# Patient Record
Sex: Male | Born: 1985 | Race: White | Hispanic: No | Marital: Single | Smoking: Current every day smoker
Health system: Southern US, Community
[De-identification: ages and names within clinical notes are randomized; demographics above are authoritative.]

## PROBLEM LIST (undated history)

## (undated) DIAGNOSIS — G5600 Carpal tunnel syndrome, unspecified upper limb: Secondary | ICD-10-CM

## (undated) DIAGNOSIS — F410 Panic disorder [episodic paroxysmal anxiety] without agoraphobia: Secondary | ICD-10-CM

---

## 2004-08-16 ENCOUNTER — Emergency Department: Payer: Self-pay | Admitting: Emergency Medicine

## 2005-01-09 ENCOUNTER — Emergency Department: Payer: Self-pay | Admitting: Internal Medicine

## 2005-03-23 ENCOUNTER — Emergency Department: Payer: Self-pay | Admitting: Emergency Medicine

## 2010-03-23 ENCOUNTER — Emergency Department: Payer: Self-pay | Admitting: Emergency Medicine

## 2010-03-24 ENCOUNTER — Emergency Department: Payer: Self-pay | Admitting: Emergency Medicine
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
## 2013-04-10 ENCOUNTER — Emergency Department (HOSPITAL_COMMUNITY)
Admission: EM | Admit: 2013-04-10 | Discharge: 2013-04-10 | Disposition: A | Discharge status: Home / Self Care | Payer: BC Managed Care – PPO | Attending: Emergency Medicine | Admitting: Emergency Medicine

## 2013-04-10 ENCOUNTER — Encounter (HOSPITAL_COMMUNITY): Payer: Self-pay | Admitting: Adult Health

## 2013-04-10 DIAGNOSIS — F17200 Nicotine dependence, unspecified, uncomplicated: Secondary | ICD-10-CM | POA: Insufficient documentation

## 2013-04-10 DIAGNOSIS — Z79899 Other long term (current) drug therapy: Secondary | ICD-10-CM | POA: Insufficient documentation

## 2013-04-10 DIAGNOSIS — M5412 Radiculopathy, cervical region: Secondary | ICD-10-CM | POA: Insufficient documentation

## 2013-04-10 DIAGNOSIS — Z9109 Other allergy status, other than to drugs and biological substances: Secondary | ICD-10-CM | POA: Insufficient documentation

## 2013-04-10 DIAGNOSIS — G5600 Carpal tunnel syndrome, unspecified upper limb: Secondary | ICD-10-CM | POA: Insufficient documentation

## 2013-04-10 HISTORY — DX: Carpal tunnel syndrome, unspecified upper limb: G56.00

## 2013-04-10 MED ORDER — NAPROXEN 500 MG PO TABS: 500.0000 mg | ORAL_TABLET | Freq: Two times a day (BID) | ORAL | Status: AC

## 2013-04-10 MED ORDER — DEXAMETHASONE SODIUM PHOSPHATE 10 MG/ML IJ SOLN
10.0000 mg | Freq: Once | INTRAMUSCULAR | Status: AC
  Administered 2013-04-10: 10 mg via INTRAMUSCULAR
  Filled 2013-04-10: qty 1

## 2013-04-10 MED ORDER — ONDANSETRON 4 MG PO TBDP
ORAL_TABLET | ORAL | Status: AC
  Administered 2013-04-10: 8 mg
  Filled 2013-04-10: qty 2

## 2013-04-10 MED ORDER — KETOROLAC TROMETHAMINE 60 MG/2ML IM SOLN
60.0000 mg | Freq: Once | INTRAMUSCULAR | Status: AC
  Administered 2013-04-10: 60 mg via INTRAMUSCULAR
  Filled 2013-04-10: qty 2

## 2013-04-10 NOTE — ED Notes (Signed)
Presents with right arm, neck and shoulder pain that began this am after waking up. He states" have no strength in this arm and it hurts really bad. It began a while ago and just felt like a sore muscle now it really hurts"  Denies injury.

## 2013-04-10 NOTE — ED Provider Notes (Signed)
History  This chart was scribed for non-physician practitioner Dierdre Forth, PA-C working with Vida Roller, MD, by Candelaria Stagers, ED Scribe. This patient was seen in room TR11C/TR11C and the patient's care was started at 5:48 PM   CSN: [id number]  Arrival date & time 04/10/13  1726   First MD Initiated Contact with Patient 04/10/13 1742      Chief Complaint  Patient presents with  . Arm Pain     The history is provided by the patient. No language interpreter was used.   HPI Comments: Jeremiah Pace is a 27 y.o. male who presents to the Emergency Department complaining of intermittent right shoulder pain that radiates down his arm which started three weeks ago and became worse this morning.  Onset of pain occurs when reaching overhead.  Pt also experiences pain to his neck and tingling to the right arm associated with the shoulder pain.  He denies trauma or injury.  Pt reports repetitive motion at work.  He has stretched and massaged the area with some relief.  Pt has taken Aleve with some relief.  Pt reports h/o carpal tunnel.      Past Medical History  Diagnosis Date  . Carpal tunnel syndrome     History reviewed. No pertinent past surgical history.  History reviewed. No pertinent family history.  History  Substance Use Topics  . Smoking status: Current Every Day Smoker    Types: Cigarettes  . Smokeless tobacco: Not on file  . Alcohol Use: Yes      Review of Systems  HENT: Positive for neck pain.   Musculoskeletal: Positive for arthralgias (right shoulder pain ).  All other systems reviewed and are negative.    Allergies  Acai and Banana  Home Medications   Current Outpatient Rx  Name  Route  Sig  Dispense  Refill  . Multiple Vitamin (MULTIVITAMIN WITH MINERALS) TABS   Oral   Take 1 tablet by mouth daily.         . naproxen (NAPROSYN) 500 MG tablet   Oral   Take 1 tablet (500 mg total) by mouth 2 (two) times daily with a meal.   30 tablet  0     BP 125/83  Pulse 86  Temp(Src) 97.5 F (36.4 C) (Oral)  Resp 18  SpO2 99%  Physical Exam  Nursing note and vitals reviewed. Constitutional: He appears well-developed and well-nourished. No distress.  HENT:  Head: Normocephalic and atraumatic.  Eyes: Conjunctivae are normal.  Neck: Normal range of motion.  Cardiovascular: Normal rate, regular rhythm and intact distal pulses.   Capillary refill less than 3 seconds.   Pulmonary/Chest: Effort normal and breath sounds normal.  Musculoskeletal: He exhibits no edema.  Full passive ROM of right shoulder. No midline tenderness. Right sided para spinal tenderness.  Mild trapezius and supraspinatus tenderness to palpation.  Pain at [facility] joint with resisted external and internal rotation.  Decreased active adduction of right shoulder.    Neurological: He is alert. Coordination normal.  Change in sensation to right arm.  Strong and equal grip strength bilaterally.    Skin: Skin is warm and dry. He is not diaphoretic.  No tenting of the skin  Psychiatric: He has a normal mood and affect.    ED Course  Procedures  DIAGNOSTIC STUDIES: Oxygen Saturation is 99% on room air, normal by my interpretation.    COORDINATION OF CARE:  5:59 PM Discussed course of care with pt which includes follow up with  orthopaedist.  Pt understands and agrees.   6:01 PM Consult with Eber Hong, MD  Labs Reviewed - No data to display No results found.   1. Cervical radiculopathy       MDM  Jeremiah Pace presents with worsening shoulder and neck pain.  Pt with neck pain that is radiating down arm. Pt also at the [facility] joint of the right shoulder on exam. There has been no recent trauma and imaging is not indicated at this time. Pt has been not previously evaluated by their PCP and is not currently taking any medication.  No evidence of cervical nerve root compression on physical exam. DC w conservative home therapies (ie heat), advice to begin taking  naprosyn, f-u w spinal orthopedics if s/s persist. I have also discussed reasons to return immediately to the ER.  Patient expresses understanding and agrees with plan.    I personally performed the services described in this documentation, which was scribed in my presence. The recorded information has been reviewed and is accurate.   Dahlia Client Mahir Prabhakar, PA-C 04/10/13 1848

## 2013-04-11 NOTE — ED Provider Notes (Signed)
  Medical screening examination/treatment/procedure(s) were performed by non-physician practitioner and as supervising physician I was immediately available for consultation/collaboration.    Billyjoe Go D Cyd Hostler, MD 04/11/13 2018 

## 2015-11-24 ENCOUNTER — Encounter: Payer: Self-pay | Admitting: Emergency Medicine

## 2015-11-24 ENCOUNTER — Emergency Department
Admission: EM | Admit: 2015-11-24 | Discharge: 2015-11-24 | Disposition: A | Discharge status: Home / Self Care | Payer: No Typology Code available for payment source | Attending: Emergency Medicine | Admitting: Emergency Medicine

## 2015-11-24 DIAGNOSIS — Y998 Other external cause status: Secondary | ICD-10-CM | POA: Insufficient documentation

## 2015-11-24 DIAGNOSIS — F17210 Nicotine dependence, cigarettes, uncomplicated: Secondary | ICD-10-CM | POA: Insufficient documentation

## 2015-11-24 DIAGNOSIS — Y9389 Activity, other specified: Secondary | ICD-10-CM | POA: Insufficient documentation

## 2015-11-24 DIAGNOSIS — Z79899 Other long term (current) drug therapy: Secondary | ICD-10-CM | POA: Diagnosis not present

## 2015-11-24 DIAGNOSIS — Y92410 Unspecified street and highway as the place of occurrence of the external cause: Secondary | ICD-10-CM | POA: Diagnosis not present

## 2015-11-24 DIAGNOSIS — S6992XA Unspecified injury of left wrist, hand and finger(s), initial encounter: Secondary | ICD-10-CM | POA: Diagnosis not present

## 2015-11-24 DIAGNOSIS — S161XXA Strain of muscle, fascia and tendon at neck level, initial encounter: Secondary | ICD-10-CM

## 2015-11-24 DIAGNOSIS — S6991XA Unspecified injury of right wrist, hand and finger(s), initial encounter: Secondary | ICD-10-CM | POA: Diagnosis not present

## 2015-11-24 DIAGNOSIS — S199XXA Unspecified injury of neck, initial encounter: Secondary | ICD-10-CM | POA: Diagnosis present

## 2015-11-24 MED ORDER — IBUPROFEN 800 MG PO TABS: 800.0000 mg | ORAL_TABLET | Freq: Three times a day (TID) | ORAL | Status: AC | PRN

## 2015-11-24 MED ORDER — CYCLOBENZAPRINE HCL 10 MG PO TABS
10.0000 mg | ORAL_TABLET | Freq: Once | ORAL | Status: AC
  Administered 2015-11-24: 10 mg via ORAL
  Filled 2015-11-24: qty 1

## 2015-11-24 MED ORDER — TRAMADOL HCL 50 MG PO TABS: 50.0000 mg | ORAL_TABLET | Freq: Four times a day (QID) | ORAL | Status: AC | PRN

## 2015-11-24 MED ORDER — IBUPROFEN 800 MG PO TABS
800.0000 mg | ORAL_TABLET | Freq: Once | ORAL | Status: AC
  Administered 2015-11-24: 800 mg via ORAL
  Filled 2015-11-24: qty 1

## 2015-11-24 MED ORDER — CYCLOBENZAPRINE HCL 10 MG PO TABS: 5.0000 mg | ORAL_TABLET | Freq: Three times a day (TID) | ORAL | Status: AC | PRN
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
## 2015-11-24 NOTE — Discharge Instructions (Signed)
Cervical Sprain A cervical sprain is when the tissues (ligaments) that hold the neck bones in place stretch or tear. HOME CARE   Put ice on the injured area.  Put ice in a plastic bag.  Place a towel between your skin and the bag.  Leave the ice on for 15-20 minutes, 3-4 times a day.  You may have been given a collar to wear. This collar keeps your neck from moving while you heal.  Do not take the collar off unless told by your doctor.  If you have long hair, keep it outside of the collar.  Ask your doctor before changing the position of your collar. You may need to change its position over time to make it more comfortable.  If you are allowed to take off the collar for cleaning or bathing, follow your doctor's instructions on how to do it safely.  Keep your collar clean by wiping it with mild soap and water. Dry it completely. If the collar has removable pads, remove them every 1-2 days to hand wash them with soap and water. Allow them to air dry. They should be dry before you wear them in the collar.  Do not drive while wearing the collar.  Only take medicine as told by your doctor.  Keep all doctor visits as told.  Keep all physical therapy visits as told.  Adjust your work station so that you have good posture while you work.  Avoid positions and activities that make your problems worse.  Warm up and stretch before being active. GET HELP IF:  Your pain is not controlled with medicine.  You cannot take less pain medicine over time as planned.  Your activity level does not improve as expected. GET HELP RIGHT AWAY IF:   You are bleeding.  Your stomach is upset.  You have an allergic reaction to your medicine.  You develop new problems that you cannot explain.  You lose feeling (become numb) or you cannot move any part of your body (paralysis).  You have tingling or weakness in any part of your body.  Your symptoms get worse. Symptoms include:  Pain,  soreness, stiffness, puffiness (swelling), or a burning feeling in your neck.  Pain when your neck is touched.  Shoulder or upper back pain.  Limited ability to move your neck.  Headache.  Dizziness.  Your hands or arms feel week, lose feeling, or tingle.  Muscle spasms.  Difficulty swallowing or chewing. MAKE SURE YOU:   Understand these instructions.  Will watch your condition.  Will get help right away if you are not doing well or get worse.   This information is not intended to replace advice given to you by your health care provider. Make sure you discuss any questions you have with your health care provider.   Document Released: 04/08/2008 Document Revised: 06/23/2013 Document Reviewed: 04/28/2013 Elsevier Interactive Patient Education 2016 [place].  Cryotherapy Cryotherapy means treatment with cold. Ice or gel packs can be used to reduce both pain and swelling. Ice is the most helpful within the first 24 to 48 hours after an injury or flare-up from overusing a muscle or joint. Sprains, strains, spasms, burning pain, shooting pain, and aches can all be eased with ice. Ice can also be used when recovering from surgery. Ice is effective, has very few side effects, and is safe for most people to use. PRECAUTIONS  Ice is not a safe treatment option for people with:  Raynaud phenomenon. This is  a condition affecting small blood vessels in the extremities. Exposure to cold may cause your problems to return. °· Cold hypersensitivity. There are many forms of cold hypersensitivity, including: °¨ Cold urticaria. Red, itchy hives appear on the skin when the tissues begin to warm after being iced. °¨ Cold erythema. This is a red, itchy rash caused by exposure to cold. °¨ Cold hemoglobinuria. Red blood cells break down when the tissues begin to warm after being iced. The hemoglobin that carry oxygen are passed into the urine because they cannot combine with blood proteins fast  enough. °· Numbness or altered sensitivity in the area being iced. °If you have any of the following conditions, do not use ice until you have discussed cryotherapy with your caregiver: °· Heart conditions, such as arrhythmia, angina, or chronic heart disease. °· High blood pressure. °· Healing wounds or open skin in the area being iced. °· Current infections. °· Rheumatoid arthritis. °· Poor circulation. °· Diabetes. °Ice slows the blood flow in the region it is applied. This is beneficial when trying to stop inflamed tissues from spreading irritating chemicals to surrounding tissues. However, if you expose your skin to cold temperatures for too long or without the proper protection, you can damage your skin or nerves. Watch for signs of skin damage due to cold. °HOME CARE INSTRUCTIONS °Follow these tips to use ice and cold packs safely. °· Place a dry or damp towel between the ice and skin. A damp towel will cool the skin more quickly, so you may need to shorten the time that the ice is used. °· For a more rapid response, add gentle compression to the ice. °· Ice for no more than 10 to 20 minutes at a time. The bonier the area you are icing, the less time it will take to get the benefits of ice. °· Check your skin after 5 minutes to make sure there are no signs of a poor response to cold or skin damage. °· Rest 20 minutes or more between uses. °· Once your skin is numb, you can end your treatment. You can test numbness by very lightly touching your skin. The touch should be so light that you do not see the skin dimple from the pressure of your fingertip. When using ice, most people will feel these normal sensations in this order: cold, burning, aching, and numbness. °· Do not use ice on someone who cannot communicate their responses to pain, such as small children or people with dementia. °HOW TO MAKE AN ICE PACK °Ice packs are the most common way to use ice therapy. Other methods include ice massage, ice baths,  and cryosprays. Muscle creams that cause a cold, tingly feeling do not offer the same benefits that ice offers and should not be used as a substitute unless recommended by your caregiver. °To make an ice pack, do one of the following: °· Place crushed ice or a bag of frozen vegetables in a sealable plastic bag. Squeeze out the excess air. Place this bag inside another plastic bag. Slide the bag into a pillowcase or place a damp towel between your skin and the bag. °· Mix 3 parts water with 1 part rubbing alcohol. Freeze the mixture in a sealable plastic bag. When you remove the mixture from the freezer, it will be slushy. Squeeze out the excess air. Place this bag inside another plastic bag. Slide the bag into a pillowcase or place a damp towel between your skin and the bag. °SEEK   MEDICAL CARE IF:  You develop white spots on your skin. This may give the skin a blotchy (mottled) appearance.  Your skin turns blue or pale.  Your skin becomes waxy or hard.  Your swelling gets worse. MAKE SURE YOU:   Understand these instructions.  Will watch your condition.  Will get help right away if you are not doing well or get worse.   This information is not intended to replace advice given to you by your health care provider. Make sure you discuss any questions you have with your health care provider.   Document Released: 06/17/2011 Document Revised: 11/11/2014 Document Reviewed: 06/17/2011 Elsevier Interactive Patient Education 2016 [employer].  Tourist information centre manager After a car crash (motor vehicle collision), it is normal to have bruises and sore muscles. The first 24 hours usually feel the worst. After that, you will likely start to feel better each day. HOME CARE  Put ice on the injured area.  Put ice in a plastic bag.  Place a towel between your skin and the bag.  Leave the ice on for 15-20 minutes, 03-04 times a day.  Drink enough fluids to keep your pee (urine) clear or pale  yellow.  Do not drink alcohol.  Take a warm shower or bath 1 or 2 times a day. This helps your sore muscles.  Return to activities as told by your doctor. Be careful when lifting. Lifting can make neck or back pain worse.  Only take medicine as told by your doctor. Do not use aspirin. GET HELP RIGHT AWAY IF:   Your arms or legs tingle, feel weak, or lose feeling (numbness).  You have headaches that do not get better with medicine.  You have neck pain, especially in the middle of the back of your neck.  You cannot control when you pee (urinate) or poop (bowel movement).  Pain is getting worse in any part of your body.  You are short of breath, dizzy, or pass out (faint).  You have chest pain.  You feel sick to your stomach (nauseous), throw up (vomit), or sweat.  You have belly (abdominal) pain that gets worse.  There is blood in your pee, poop, or throw up.  You have pain in your shoulder (shoulder strap areas).  Your problems are getting worse. MAKE SURE YOU:   Understand these instructions.  Will watch your condition.  Will get help right away if you are not doing well or get worse.   This information is not intended to replace advice given to you by your health care provider. Make sure you discuss any questions you have with your health care provider.   Document Released: 04/08/2008 Document Revised: 01/13/2012 Document Reviewed: 03/20/2011 Elsevier Interactive Patient Education [employer]
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
## 2015-12-29 ENCOUNTER — Encounter: Payer: Self-pay | Admitting: Emergency Medicine

## 2015-12-29 ENCOUNTER — Emergency Department
Admission: EM | Admit: 2015-12-29 | Discharge: 2015-12-29 | Disposition: A | Discharge status: Home / Self Care | Payer: No Typology Code available for payment source | Attending: Emergency Medicine | Admitting: Emergency Medicine

## 2015-12-29 DIAGNOSIS — Z79899 Other long term (current) drug therapy: Secondary | ICD-10-CM | POA: Insufficient documentation

## 2015-12-29 DIAGNOSIS — F410 Panic disorder [episodic paroxysmal anxiety] without agoraphobia: Secondary | ICD-10-CM | POA: Insufficient documentation

## 2015-12-29 DIAGNOSIS — F17210 Nicotine dependence, cigarettes, uncomplicated: Secondary | ICD-10-CM | POA: Insufficient documentation

## 2015-12-29 DIAGNOSIS — Z791 Long term (current) use of non-steroidal anti-inflammatories (NSAID): Secondary | ICD-10-CM | POA: Insufficient documentation

## 2015-12-29 HISTORY — DX: Panic disorder (episodic paroxysmal anxiety): F41.0

## 2015-12-29 LAB — COMPREHENSIVE METABOLIC PANEL
ALBUMIN: 4.3 g/dL (ref 3.5–5.0)
ALT: 15 U/L — ABNORMAL LOW (ref 17–63)
ANION GAP: 4 — AB (ref 5–15)
AST: 22 U/L (ref 15–41)
Alkaline Phosphatase: 74 U/L (ref 38–126)
BUN: 16 mg/dL (ref 6–20)
CHLORIDE: 106 mmol/L (ref 101–111)
CO2: 26 mmol/L (ref 22–32)
Calcium: 8.9 mg/dL (ref 8.9–10.3)
Creatinine, Ser: 0.86 mg/dL (ref 0.61–1.24)
GFR calc non Af Amer: >60 mL/min (ref >60)
Glucose, Bld: 109 mg/dL — ABNORMAL HIGH (ref 65–99)
POTASSIUM: 3.6 mmol/L (ref 3.5–5.1)
SODIUM: 136 mmol/L (ref 135–145)
Total Bilirubin: 0.3 mg/dL (ref 0.3–1.2)
Total Protein: 7.6 g/dL (ref 6.5–8.1)

## 2015-12-29 LAB — CBC
HEMATOCRIT: 41.4 % (ref 40.0–52.0)
HEMOGLOBIN: 14.3 g/dL (ref 13.0–18.0)
MCH: 30.9 pg (ref 26.0–34.0)
MCHC: 34.4 g/dL (ref 32.0–36.0)
MCV: 89.6 fL (ref 80.0–100.0)
PLATELETS: 292 10*3/uL (ref 150–440)
RBC: 4.62 MIL/uL (ref 4.40–5.90)
RDW: 12.5 % (ref 11.5–14.5)
WBC: 9.2 10*3/uL (ref 3.8–10.6)

## 2015-12-29 LAB — TROPONIN I: Troponin I: <0.03 ng/mL (ref <0.031)

## 2015-12-29 NOTE — Discharge Instructions (Signed)
Please seek medical attention for any high fevers, chest pain, shortness of breath, change in behavior, persistent vomiting, bloody stool or any other new or concerning symptoms.   Panic Attacks Panic attacks are sudden, short feelings of great fear or discomfort. You may have them for no reason when you are relaxed, when you are uneasy (anxious), or when you are sleeping.  HOME CARE  Take all your medicines as told.  Check with your doctor before starting new medicines.  Keep all doctor visits. GET HELP IF:  You are not able to take your medicines as told.  Your symptoms do not get better.  Your symptoms get worse. GET HELP RIGHT AWAY IF:  Your attacks seem different than your normal attacks.  You have thoughts about hurting yourself or others.  You take panic attack medicine and you have a side effect. MAKE SURE YOU:  Understand these instructions.  Will watch your condition.  Will get help right away if you are not doing well or get worse.   This information is not intended to replace advice given to you by your health care provider. Make sure you discuss any questions you have with your health care provider.   Document Released: 11/23/2010 Document Revised: 08/11/2013 Document Reviewed: 06/04/2013 Elsevier Interactive Patient Education [employer]
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
## 2015-12-29 NOTE — ED Notes (Addendum)
Pt c/o recent stressor causing panic attack today, c/o hyperventilating with CP,SOB, and tingling. Pt states it lasted approx 1hour. Pt in no distress at this time. VS stable. Pt A&O
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
